# Patient Record
Sex: Male | Born: 1975 | Marital: Single | Smoking: Current every day smoker
Health system: Southern US, Community
[De-identification: ages and names within clinical notes are randomized; demographics above are authoritative.]

## PROBLEM LIST (undated history)

## (undated) DIAGNOSIS — F410 Panic disorder [episodic paroxysmal anxiety] without agoraphobia: Secondary | ICD-10-CM

## (undated) DIAGNOSIS — F419 Anxiety disorder, unspecified: Secondary | ICD-10-CM

## (undated) DIAGNOSIS — I10 Essential (primary) hypertension: Secondary | ICD-10-CM

## (undated) HISTORY — PX: SPHINCTEROTOMY: SHX5279

---

## 2005-02-02 ENCOUNTER — Ambulatory Visit: Payer: Self-pay | Admitting: Physical Medicine & Rehabilitation

## 2005-02-02 ENCOUNTER — Encounter
Admission: RE | Admit: 2005-02-02 | Discharge: 2005-05-03 | Payer: Self-pay | Admitting: Physical Medicine & Rehabilitation

## 2011-08-29 ENCOUNTER — Encounter (HOSPITAL_COMMUNITY): Payer: Self-pay

## 2011-08-29 ENCOUNTER — Emergency Department (HOSPITAL_COMMUNITY): Payer: Medicare Other

## 2011-08-29 ENCOUNTER — Emergency Department (HOSPITAL_COMMUNITY)
Admission: EM | Admit: 2011-08-29 | Discharge: 2011-08-29 | Disposition: A | Discharge status: Home / Self Care | Payer: Medicare Other | Attending: Emergency Medicine | Admitting: Emergency Medicine

## 2011-08-29 DIAGNOSIS — R112 Nausea with vomiting, unspecified: Secondary | ICD-10-CM | POA: Insufficient documentation

## 2011-08-29 DIAGNOSIS — K5900 Constipation, unspecified: Secondary | ICD-10-CM | POA: Insufficient documentation

## 2011-08-29 DIAGNOSIS — R109 Unspecified abdominal pain: Secondary | ICD-10-CM | POA: Insufficient documentation

## 2011-08-29 HISTORY — DX: Anxiety disorder, unspecified: F41.9

## 2011-08-29 HISTORY — DX: Essential (primary) hypertension: I10

## 2011-08-29 HISTORY — DX: Panic disorder (episodic paroxysmal anxiety): F41.0

## 2011-08-29 LAB — CBC WITH DIFFERENTIAL/PLATELET
Basophils Absolute: 0 10*3/uL (ref 0.0–0.1)
Eosinophils Relative: 1 % (ref 0–5)
Lymphocytes Relative: 22 % (ref 12–46)
Lymphs Abs: 3 10*3/uL (ref 0.7–4.0)
MCV: 89.7 fL (ref 78.0–100.0)
Neutro Abs: 9.5 10*3/uL — ABNORMAL HIGH (ref 1.7–7.7)
Neutrophils Relative %: 71 % (ref 43–77)
Platelets: 255 10*3/uL (ref 150–400)
RBC: 4.75 MIL/uL (ref 4.22–5.81)
RDW: 15.1 % (ref 11.5–15.5)
WBC: 13.3 10*3/uL — ABNORMAL HIGH (ref 4.0–10.5)

## 2011-08-29 LAB — URINALYSIS, ROUTINE W REFLEX MICROSCOPIC
Bilirubin Urine: NEGATIVE
Ketones, ur: NEGATIVE mg/dL
Leukocytes, UA: NEGATIVE
Nitrite: NEGATIVE
Specific Gravity, Urine: <1.005 — ABNORMAL LOW (ref 1.005–1.030)
Urobilinogen, UA: 0.2 mg/dL (ref 0.0–1.0)

## 2011-08-29 LAB — COMPREHENSIVE METABOLIC PANEL
ALT: 13 U/L (ref 0–53)
AST: 13 U/L (ref 0–37)
Alkaline Phosphatase: 82 U/L (ref 39–117)
CO2: 28 mEq/L (ref 19–32)
Calcium: 9.7 mg/dL (ref 8.4–10.5)
GFR calc non Af Amer: 67 mL/min — ABNORMAL LOW (ref >90)
Glucose, Bld: 99 mg/dL (ref 70–99)
Potassium: 3.9 mEq/L (ref 3.5–5.1)
Sodium: 130 mEq/L — ABNORMAL LOW (ref 135–145)

## 2011-08-29 MED ORDER — ONDANSETRON HCL 4 MG/2ML IJ SOLN: 4.0000 mg | INTRAMUSCULAR | Status: DC | PRN

## 2011-08-29 MED ORDER — SODIUM CHLORIDE 0.9 % IV BOLUS (SEPSIS)
500.0000 mL | Freq: Once | INTRAVENOUS | Status: AC
  Administered 2011-08-29: 19:00:00 via INTRAVENOUS

## 2011-08-29 MED ORDER — DICYCLOMINE HCL 20 MG PO TABS: 20.0000 mg | ORAL_TABLET | Freq: Four times a day (QID) | ORAL | Status: DC | PRN

## 2011-08-29 MED ORDER — SODIUM CHLORIDE 0.9 % IV SOLN
INTRAVENOUS | Status: DC
  Administered 2011-08-29 (×2): via INTRAVENOUS

## 2011-08-29 MED ORDER — FAMOTIDINE IN NACL 20-0.9 MG/50ML-% IV SOLN
20.0000 mg | Freq: Once | INTRAVENOUS | Status: AC
  Administered 2011-08-29: 20 mg via INTRAVENOUS
  Filled 2011-08-29: qty 50

## 2011-08-29 MED ORDER — DICYCLOMINE HCL 10 MG PO CAPS
20.0000 mg | ORAL_CAPSULE | Freq: Once | ORAL | Status: AC
  Administered 2011-08-29: 20 mg via ORAL
  Filled 2011-08-29: qty 1
  Filled 2011-08-29: qty 2

## 2011-08-29 MED ORDER — IOHEXOL 300 MG/ML  SOLN
100.0000 mL | Freq: Once | INTRAMUSCULAR | Status: AC | PRN
  Administered 2011-08-29: 100 mL via INTRAVENOUS

## 2011-08-29 NOTE — ED Notes (Signed)
Pt c/o weakness, abdominal cramps off and on x 1 month. Pt states that he had a sphincterotomy a month ago and his bowels have been better up until a week ago. Pt states that he has "hard stomach cramps". Last BM was Saturday. Pt states that he gets up in the morning and takes his Clonidine and lies back down and goes back to sleep. States that he forgets and thinks that he is taking too many per day. Pt alert and oriented x 3. Skin warm and dry. Color pink. Breath sounds clear and equal bilaterally. Abdomen soft and non tender.  Pt has positive bowel sounds in all quadrants.

## 2011-08-29 NOTE — ED Notes (Signed)
Pt sleeping with no complaints at this time. Family at bedside.

## 2011-08-29 NOTE — ED Notes (Signed)
Pt reports generalized abd pain off and on for the past week.  Describes as a "hard cramp."  Reports vomited x 1 Sunday.  Denies diarrhea.  LBM was Saturday.

## 2011-08-29 NOTE — ED Provider Notes (Signed)
History   Scribed for Antonio Anger, DO, the patient was seen in room APA18/APA18 . This chart was scribed by Antonio Fitzgerald.   CSN: [id number]  Arrival date & time 08/29/11  1545   First MD Initiated Contact with Patient 08/29/11 1635      Chief Complaint  Patient presents with  . Abdominal Pain     HPI Pt was seen at 1645. Antonio Fitzgerald is a 36 y.o. male who presents to the Emergency Department complaining of gradual onset and persistence of waxing and waning generalized abd "pain" for the past 1 month, worse over the past 2 weeks. Pt describes the pain as "cramping." Pt reports 1 episode of emesis 2 days ago. Has been associated with nausea and generalized weakness/fatigue. Pt states last BM was 4 days ago. Denies back pain, no diarrhea, no fevers, no rash, no CP/SOB, no testicular pain/swelling, no dysuria/hematuria.    Past Medical History  Diagnosis Date  . Hypertension   . Anxiety   . Panic attacks     Past Surgical History  Procedure Date  . Sphincterotomy     3-4 weeks ago    History  Substance Use Topics  . Smoking status: Current Everyday Smoker  . Smokeless tobacco: Not on file  . Alcohol Use: No      Review of Systems ROS: Statement: All systems negative except as marked or noted in the HPI; Constitutional: +generalized fatigue. Negative for fever and chills. ; ; Eyes: Negative for eye pain, redness and discharge. ; ; ENMT: Negative for ear pain, hoarseness, nasal congestion, sinus pressure and sore throat. ; ; Cardiovascular: Negative for chest pain, palpitations, diaphoresis, dyspnea and peripheral edema. ; ; Respiratory: Negative for cough, wheezing and stridor. ; ; Gastrointestinal: +N/V, abd pain. Negative for diarrhea, blood in stool, hematemesis, jaundice and rectal bleeding. . ; ; Genitourinary: Negative for dysuria, flank pain and hematuria. ; ; Musculoskeletal: Negative for back pain and neck pain. Negative for swelling and trauma.; ; Skin:  Negative for pruritus, rash, abrasions, blisters, bruising and skin lesion.; ; Neuro: Negative for headache, lightheadedness and neck stiffness. Negative for weakness, altered level of consciousness , altered mental status, extremity weakness, paresthesias, involuntary movement, seizure and syncope.       Allergies  Review of patient's allergies indicates no known allergies.  Home Medications   Current Outpatient Rx  Name Route Sig Dispense Refill  . CLONAZEPAM 1 MG PO TABS Oral Take 1 mg by mouth 4 (four) times daily.    Marland Kitchen CLONIDINE HCL 0.2 MG PO TABS Oral Take 0.2 mg by mouth 2 (two) times daily.    Marland Kitchen ZOLPIDEM TARTRATE 10 MG PO TABS Oral Take 10 mg by mouth at bedtime as needed. For sleep      BP 119/90  Pulse 66  Temp 98.4 F (36.9 C) (Oral)  Resp 18  Ht 5\' 9"  (1.753 m)  Wt 245 lb (111.131 kg)  BMI 36.18 kg/m2  SpO2 96%  Physical Exam 1650: Physical examination:  Nursing notes reviewed; Vital signs and O2 SAT reviewed;  Constitutional: Well developed, Well nourished, Well hydrated, In no acute distress; Head:  Normocephalic, atraumatic; Eyes: EOMI, PERRL, No scleral icterus; ENMT: Mouth and pharynx normal, Mucous membranes moist; Neck: Supple, Full range of motion, No lymphadenopathy; Cardiovascular: Regular rate and rhythm, No murmur, rub, or gallop; Respiratory: Breath sounds clear & equal bilaterally, No rales, rhonchi, wheezes.  Speaking full sentences with ease, Normal respiratory effort/excursion; Chest: Nontender, Movement normal; Abdomen:  Soft, +mild diffuse tenderness to palp. No rebound or guarding. Nondistended, Normal bowel sounds;; Extremities: Pulses normal, No tenderness, No edema, No calf edema or asymmetry.; Neuro: AA&Ox3, Major CN grossly intact.  Speech clear. No gross focal motor or sensory deficits in extremities.; Skin: Color normal, Warm, Dry.; Psych:  Affect flat, poor eye contact.    ED Course  Procedures   MDM  MDM Reviewed: nursing note and  vitals Interpretation: labs and CT scan   Results for orders placed during the hospital encounter of 08/29/11  CBC WITH DIFFERENTIAL      Component Value Range   WBC 13.3 (*) 4.0 - 10.5 K/uL   RBC 4.75  4.22 - 5.81 MIL/uL   Hemoglobin 15.6  13.0 - 17.0 g/dL   HCT 16.1  09.6 - 04.5 %   MCV 89.7  78.0 - 100.0 fL   MCH 32.8  26.0 - 34.0 pg   MCHC 36.6 (*) 30.0 - 36.0 g/dL   RDW 40.9  81.1 - 91.4 %   Platelets 255  150 - 400 K/uL   Neutrophils Relative 71  43 - 77 %   Neutro Abs 9.5 (*) 1.7 - 7.7 K/uL   Lymphocytes Relative 22  12 - 46 %   Lymphs Abs 3.0  0.7 - 4.0 K/uL   Monocytes Relative 6  3 - 12 %   Monocytes Absolute 0.8  0.1 - 1.0 K/uL   Eosinophils Relative 1  0 - 5 %   Eosinophils Absolute 0.1  0.0 - 0.7 K/uL   Basophils Relative 0  0 - 1 %   Basophils Absolute 0.0  0.0 - 0.1 K/uL  COMPREHENSIVE METABOLIC PANEL      Component Value Range   Sodium 130 (*) 135 - 145 mEq/L   Potassium 3.9  3.5 - 5.1 mEq/L   Chloride 93 (*) 96 - 112 mEq/L   CO2 28  19 - 32 mEq/L   Glucose, Bld 99  70 - 99 mg/dL   BUN 7  6 - 23 mg/dL   Creatinine, Ser 7.82  0.50 - 1.35 mg/dL   Calcium 9.7  8.4 - 95.6 mg/dL   Total Protein 7.7  6.0 - 8.3 g/dL   Albumin 4.0  3.5 - 5.2 g/dL   AST 13  0 - 37 U/L   ALT 13  0 - 53 U/L   Alkaline Phosphatase 82  39 - 117 U/L   Total Bilirubin 0.4  0.3 - 1.2 mg/dL   GFR calc non Af Amer 67 (*) >90 mL/min   GFR calc Af Amer 77 (*) >90 mL/min  LIPASE, BLOOD      Component Value Range   Lipase 11  11 - 59 U/L  URINALYSIS, ROUTINE W REFLEX MICROSCOPIC      Component Value Range   Color, Urine YELLOW  YELLOW   APPearance CLEAR  CLEAR   Specific Gravity, Urine <1.005 (*) 1.005 - 1.030   pH 6.0  5.0 - 8.0   Glucose, UA NEGATIVE  NEGATIVE mg/dL   Hgb urine dipstick NEGATIVE  NEGATIVE   Bilirubin Urine NEGATIVE  NEGATIVE   Ketones, ur NEGATIVE  NEGATIVE mg/dL   Protein, ur NEGATIVE  NEGATIVE mg/dL   Urobilinogen, UA 0.2  0.0 - 1.0 mg/dL   Nitrite NEGATIVE   NEGATIVE   Leukocytes, UA NEGATIVE  NEGATIVE   Ct Abdomen Pelvis W Contrast 08/29/2011  *RADIOLOGY REPORT*  Clinical Data: Overall with cramping and pain.  History of sphincterotomy 1  month ago.  No bowel movement for 3 days.  CT ABDOMEN AND PELVIS WITH CONTRAST  Technique:  Multidetector CT imaging of the abdomen and pelvis was performed following the standard protocol during bolus administration of intravenous contrast.  Contrast: OMNIPAQUE IOHEXOL 300 MG/ML  SOLN  Comparison: None.  Findings: There is some atelectasis in the lung bases.  No pleural or pericardial effusion.  A tiny hypoattenuating lesion in the right hepatic lobe on image 15 cannot be characterized but likely represents a cyst.  A second similar lesion is seen in the right hepatic lobe on image 23.  The liver is otherwise unremarkable.  The gallbladder, biliary tree, adrenal glands, spleen, pancreas and left kidney appear normal.  A 0.6 cm hypoattenuating lesion in the right kidney cannot be definitively characterize but likely represents a cyst.  The stomach, small bowel and appendix appear normal.  The sigmoid colon is decompressed but there is a fairly prominent volume of gas and stool throughout the remainder of the colon, particularly the ascending and transverse colon.  There is no lymphadenopathy or fluid.  No focal bony abnormality.  IMPRESSION:  1.  No acute finding. 2.  Large volume gas and stool in the colon, particularly the ascending and transverse.  Original Report Authenticated By: Bernadene Bell. D'ALESSIO, M.D.     1945:  Pt feels improved after meds and wants to go home now.  IVF given, VSS.  Dx testing d/w pt and family.  Questions answered.  Verb understanding, agreeable to d/c home with outpt f/u.        I personally performed the services described in this documentation, which was scribed in my presence. The recorded information has been reviewed and considered. Kilah Drahos Allison Quarry,  DO 08/30/11 1850

## 2011-08-31 LAB — URINE CULTURE: Culture: NO GROWTH

## 2015-05-17 ENCOUNTER — Emergency Department (HOSPITAL_COMMUNITY)
Admission: EM | Admit: 2015-05-17 | Discharge: 2015-05-17 | Discharge status: Against Medical Advice | Payer: Medicare HMO | Attending: Dermatology | Admitting: Dermatology

## 2015-05-17 ENCOUNTER — Encounter (HOSPITAL_COMMUNITY): Payer: Self-pay | Admitting: Emergency Medicine

## 2015-05-17 DIAGNOSIS — Z5321 Procedure and treatment not carried out due to patient leaving prior to being seen by health care provider: Secondary | ICD-10-CM | POA: Insufficient documentation

## 2015-05-17 DIAGNOSIS — F17200 Nicotine dependence, unspecified, uncomplicated: Secondary | ICD-10-CM | POA: Diagnosis not present

## 2015-05-17 DIAGNOSIS — R51 Headache: Secondary | ICD-10-CM | POA: Insufficient documentation

## 2015-05-17 DIAGNOSIS — I10 Essential (primary) hypertension: Secondary | ICD-10-CM | POA: Diagnosis not present
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
## 2015-05-17 NOTE — ED Notes (Signed)
Called patient to place in room. Patient states he does not want to be seen. States his family member is here and getting ready to be discharged and he wants to go home.

## 2015-06-06 ENCOUNTER — Encounter (HOSPITAL_COMMUNITY): Payer: Self-pay

## 2015-06-06 ENCOUNTER — Emergency Department (HOSPITAL_COMMUNITY)
Admission: EM | Admit: 2015-06-06 | Discharge: 2015-06-06 | Disposition: A | Discharge status: Home / Self Care | Payer: Medicare HMO | Attending: Emergency Medicine | Admitting: Emergency Medicine

## 2015-06-06 DIAGNOSIS — F13239 Sedative, hypnotic or anxiolytic dependence with withdrawal, unspecified: Secondary | ICD-10-CM | POA: Diagnosis present

## 2015-06-06 DIAGNOSIS — S80812A Abrasion, left lower leg, initial encounter: Secondary | ICD-10-CM | POA: Diagnosis not present

## 2015-06-06 DIAGNOSIS — W1839XA Other fall on same level, initial encounter: Secondary | ICD-10-CM | POA: Insufficient documentation

## 2015-06-06 DIAGNOSIS — S40812A Abrasion of left upper arm, initial encounter: Secondary | ICD-10-CM | POA: Diagnosis not present

## 2015-06-06 DIAGNOSIS — F17200 Nicotine dependence, unspecified, uncomplicated: Secondary | ICD-10-CM | POA: Diagnosis not present

## 2015-06-06 DIAGNOSIS — Z79899 Other long term (current) drug therapy: Secondary | ICD-10-CM | POA: Insufficient documentation

## 2015-06-06 DIAGNOSIS — F329 Major depressive disorder, single episode, unspecified: Secondary | ICD-10-CM | POA: Insufficient documentation

## 2015-06-06 DIAGNOSIS — Y9389 Activity, other specified: Secondary | ICD-10-CM | POA: Insufficient documentation

## 2015-06-06 DIAGNOSIS — F1110 Opioid abuse, uncomplicated: Secondary | ICD-10-CM | POA: Diagnosis not present

## 2015-06-06 DIAGNOSIS — I10 Essential (primary) hypertension: Secondary | ICD-10-CM | POA: Insufficient documentation

## 2015-06-06 DIAGNOSIS — S80811A Abrasion, right lower leg, initial encounter: Secondary | ICD-10-CM | POA: Insufficient documentation

## 2015-06-06 DIAGNOSIS — F410 Panic disorder [episodic paroxysmal anxiety] without agoraphobia: Secondary | ICD-10-CM | POA: Diagnosis not present

## 2015-06-06 DIAGNOSIS — Y9289 Other specified places as the place of occurrence of the external cause: Secondary | ICD-10-CM | POA: Diagnosis not present

## 2015-06-06 DIAGNOSIS — S40811A Abrasion of right upper arm, initial encounter: Secondary | ICD-10-CM | POA: Diagnosis not present

## 2015-06-06 DIAGNOSIS — F13930 Sedative, hypnotic or anxiolytic use, unspecified with withdrawal, uncomplicated: Secondary | ICD-10-CM

## 2015-06-06 DIAGNOSIS — F419 Anxiety disorder, unspecified: Secondary | ICD-10-CM

## 2015-06-06 DIAGNOSIS — R000 Tachycardia, unspecified: Secondary | ICD-10-CM | POA: Insufficient documentation

## 2015-06-06 DIAGNOSIS — F13230 Sedative, hypnotic or anxiolytic dependence with withdrawal, uncomplicated: Secondary | ICD-10-CM | POA: Insufficient documentation

## 2015-06-06 DIAGNOSIS — Y998 Other external cause status: Secondary | ICD-10-CM | POA: Insufficient documentation

## 2015-06-06 LAB — COMPREHENSIVE METABOLIC PANEL
ALBUMIN: 3.9 g/dL (ref 3.5–5.0)
ALT: 17 U/L (ref 17–63)
ANION GAP: 7 (ref 5–15)
AST: 28 U/L (ref 15–41)
Alkaline Phosphatase: 70 U/L (ref 38–126)
BILIRUBIN TOTAL: 0.9 mg/dL (ref 0.3–1.2)
CHLORIDE: 101 mmol/L (ref 101–111)
CO2: 28 mmol/L (ref 22–32)
Calcium: 9.6 mg/dL (ref 8.9–10.3)
Creatinine, Ser: 1.08 mg/dL (ref 0.61–1.24)
GFR calc Af Amer: >60 mL/min (ref >60)
GFR calc non Af Amer: >60 mL/min (ref >60)
GLUCOSE: 117 mg/dL — AB (ref 65–99)
POTASSIUM: 3.7 mmol/L (ref 3.5–5.1)
SODIUM: 136 mmol/L (ref 135–145)
Total Protein: 7.2 g/dL (ref 6.5–8.1)

## 2015-06-06 LAB — CBC WITH DIFFERENTIAL/PLATELET
BASOS PCT: 0 %
Basophils Absolute: 0 10*3/uL (ref 0.0–0.1)
Eosinophils Absolute: 0 10*3/uL (ref 0.0–0.7)
Eosinophils Relative: 0 %
HEMATOCRIT: 43.2 % (ref 39.0–52.0)
HEMOGLOBIN: 15.1 g/dL (ref 13.0–17.0)
LYMPHS PCT: 23 %
Lymphs Abs: 2.6 10*3/uL (ref 0.7–4.0)
MCH: 30.6 pg (ref 26.0–34.0)
MCHC: 35 g/dL (ref 30.0–36.0)
MCV: 87.4 fL (ref 78.0–100.0)
MONO ABS: 1 10*3/uL (ref 0.1–1.0)
MONOS PCT: 9 %
NEUTROS ABS: 7.8 10*3/uL — AB (ref 1.7–7.7)
NEUTROS PCT: 68 %
Platelets: 211 10*3/uL (ref 150–400)
RBC: 4.94 MIL/uL (ref 4.22–5.81)
RDW: 13.5 % (ref 11.5–15.5)
WBC: 11.5 10*3/uL — ABNORMAL HIGH (ref 4.0–10.5)

## 2015-06-06 LAB — RAPID URINE DRUG SCREEN, HOSP PERFORMED
AMPHETAMINES: NOT DETECTED
Barbiturates: NOT DETECTED
Benzodiazepines: NOT DETECTED
COCAINE: NOT DETECTED
Opiates: POSITIVE — AB
Tetrahydrocannabinol: NOT DETECTED

## 2015-06-06 LAB — ETHANOL: Alcohol, Ethyl (B): <5 mg/dL (ref <5)

## 2015-06-06 MED ORDER — LORAZEPAM 2 MG/ML IJ SOLN
2.0000 mg | Freq: Once | INTRAMUSCULAR | Status: AC
Start: 2015-06-06 — End: 2015-06-06
  Administered 2015-06-06: 2 mg via INTRAVENOUS
  Filled 2015-06-06: qty 1

## 2015-06-06 MED ORDER — ALPRAZOLAM 1 MG PO TABS: 1.0000 mg | ORAL_TABLET | Freq: Four times a day (QID) | ORAL | Status: AC

## 2015-06-06 NOTE — Discharge Instructions (Signed)
Benzodiazepine Withdrawal  °Benzodiazepines are a group of drugs that are prescribed for both short-term and long-term treatment of a variety of medical conditions. For some of these conditions, such as seizures and sudden and severe muscle spasms, they are used only for a few hours or a few days. For other conditions, such as anxiety, sleep problems, or frequent muscle spasms or to help prevent seizures, they are used for an extended period, usually weeks or months. °Benzodiazepines work by changing the way your brain functions. Normally, chemicals in your brain called neurotransmitters send messages between your brain cells. The neurotransmitter that benzodiazepines affect is called gamma-aminobutyric acid (GABA). GABA sends out messages that have a calming effect on many of the functions of your brain. Benzodiazepines make these messages stronger and increase this calming effect. °Short-term use of benzodiazepines usually does not cause problems when you stop taking the drugs. However, if you take benzodiazepines for a long time, your body can adjust to the drug and require more of it to produce the same effect (drug tolerance). Eventually, you can develop physical dependence on benzodiazepines, which is when you experience negative effects if your dosage of benzodiazepines is reduced or stopped too quickly. These negative effects are called symptoms of withdrawal. °SYMPTOMS °Symptoms of withdrawal may begin anytime within the first 10 days after you stop taking the benzodiazepine. They can last from several weeks up to a few months but usually are the worst between the first 10 to 14 days.  °The actual symptoms also vary, depending on the type of benzodiazepine you take. Possible symptoms include: °· Anxiety. °· Excitability. °· Irritability. °· Depression. °· Mood swings. °· Trouble sleeping. °· Confusion. °· Uncontrollable shaking (tremors). °· Muscle weakness. °· Seizures. °DIAGNOSIS °To diagnose  benzodiazepine withdrawal, your caregiver will examine you for certain signs, such as: °· Rapid heartbeat. °· Rapid breathing. °· Tremors. °· High blood pressure. °· Fever. °· Mood changes. °Your caregiver also may ask the following questions about your use of benzodiazepines: °· What type of benzodiazepine did you take? °· How much did you take each day? °· How long did you take the drug? °· When was the last time you took the drug? °· Do you take any other drugs? °· Have you had alcohol recently? °· Have you had a seizure recently? °· Have you lost consciousness recently? °· Have you had trouble remembering recent events? °· Have you had a recent increase in anxiety, irritability, or trouble sleeping? °A drug test also may be administered. °TREATMENT °The treatment for benzodiazepine withdrawal can vary, depending on the type and severity of your symptoms, what type of benzodiazepine you have been taking, and how long you have been taking the benzodiazepine. Sometimes it is necessary for you to be treated in a hospital, especially if you are at risk of seizures.  °Often, treatment includes a prescription for a long-acting benzodiazepine, the dosage of which is reduced slowly over a long period. This period could be several weeks or months. Eventually, your dosage will be reduced to a point that you can stop taking the drug, without experiencing withdrawal symptoms. This is called tapered withdrawal. Occasionally, minor symptoms of withdrawal continue for a few days or weeks after you have completed a tapered withdrawal. °SEEK IMMEDIATE MEDICAL CARE IF: °· You have a seizure. °· You develop a craving for drugs or alcohol. °· You begin to experience symptoms of withdrawal during your tapered withdrawal. °· You become very confused. °· You lose consciousness. °· You   have trouble breathing. °· You think about hurting yourself or someone else. °  °This information is not intended to replace advice given to you by your  health care provider. Make sure you discuss any questions you have with your health care provider. °  °Document Released: 12/22/2010 Document Revised: 01/23/2014 Document Reviewed: 06/24/2014 °Elsevier Interactive Patient Education ©2016 [employer] ° °

## 2015-06-06 NOTE — ED Notes (Signed)
Pt placed on monitor and on to monitor upon arrival to room.Pt monitored by blood pressure, pulse ox, and 12 lead. Pt is noted to have mother at bedside. PA at bedside.

## 2015-06-06 NOTE — ED Notes (Addendum)
Pt given urinal so he can give us a sample.
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
## 2015-06-06 NOTE — ED Notes (Addendum)
Patient has not had his xanax in 1 week, was taking 4mg  daily. Reports seizure last night due to same. Anxious and tremor noted on arrival. Alert and oriented

## 2016-08-11 ENCOUNTER — Emergency Department (HOSPITAL_COMMUNITY)
Admission: EM | Admit: 2016-08-11 | Discharge: 2016-08-11 | Discharge status: Against Medical Advice | Payer: Medicare HMO | Attending: Emergency Medicine | Admitting: Emergency Medicine

## 2016-08-11 ENCOUNTER — Encounter (HOSPITAL_COMMUNITY): Payer: Self-pay | Admitting: Emergency Medicine

## 2016-08-11 ENCOUNTER — Emergency Department (HOSPITAL_COMMUNITY): Payer: Medicare HMO

## 2016-08-11 DIAGNOSIS — F17200 Nicotine dependence, unspecified, uncomplicated: Secondary | ICD-10-CM | POA: Insufficient documentation

## 2016-08-11 DIAGNOSIS — F13230 Sedative, hypnotic or anxiolytic dependence with withdrawal, uncomplicated: Secondary | ICD-10-CM | POA: Insufficient documentation

## 2016-08-11 DIAGNOSIS — R531 Weakness: Secondary | ICD-10-CM | POA: Diagnosis present

## 2016-08-11 DIAGNOSIS — Z79899 Other long term (current) drug therapy: Secondary | ICD-10-CM | POA: Insufficient documentation

## 2016-08-11 DIAGNOSIS — F13930 Sedative, hypnotic or anxiolytic use, unspecified with withdrawal, uncomplicated: Secondary | ICD-10-CM

## 2016-08-11 DIAGNOSIS — I10 Essential (primary) hypertension: Secondary | ICD-10-CM | POA: Insufficient documentation

## 2016-08-11 LAB — CBC WITH DIFFERENTIAL/PLATELET
Basophils Absolute: 0 10*3/uL (ref 0.0–0.1)
Basophils Relative: 0 %
Eosinophils Absolute: 0 10*3/uL (ref 0.0–0.7)
Eosinophils Relative: 0 %
HCT: 46.5 % (ref 39.0–52.0)
Hemoglobin: 17.9 g/dL — ABNORMAL HIGH (ref 13.0–17.0)
Lymphocytes Relative: 20 %
Lymphs Abs: 3.3 10*3/uL (ref 0.7–4.0)
MCH: 32.2 pg (ref 26.0–34.0)
MCHC: 37.8 g/dL — ABNORMAL HIGH (ref 30.0–36.0)
MCV: 85 fL (ref 78.0–100.0)
Monocytes Absolute: 1.2 10*3/uL — ABNORMAL HIGH (ref 0.1–1.0)
Monocytes Relative: 7 %
Neutro Abs: 12.3 10*3/uL — ABNORMAL HIGH (ref 1.7–7.7)
Neutrophils Relative %: 73 %
Platelets: 203 10*3/uL (ref 150–400)
RBC: 5.47 MIL/uL (ref 4.22–5.81)
RDW: 12.8 % (ref 11.5–15.5)
WBC: 16.8 10*3/uL — ABNORMAL HIGH (ref 4.0–10.5)

## 2016-08-11 LAB — BASIC METABOLIC PANEL
Anion gap: 13 (ref 5–15)
BUN: 6 mg/dL (ref 6–20)
CO2: 24 mmol/L (ref 22–32)
Calcium: 9.6 mg/dL (ref 8.9–10.3)
Chloride: 97 mmol/L — ABNORMAL LOW (ref 101–111)
Creatinine, Ser: 0.96 mg/dL (ref 0.61–1.24)
GFR calc Af Amer: >60 mL/min (ref >60)
GFR calc non Af Amer: >60 mL/min (ref >60)
Glucose, Bld: 120 mg/dL — ABNORMAL HIGH (ref 65–99)
Potassium: 3.5 mmol/L (ref 3.5–5.1)
Sodium: 134 mmol/L — ABNORMAL LOW (ref 135–145)

## 2016-08-11 MED ORDER — SODIUM CHLORIDE 0.9 % IV BOLUS (SEPSIS)
1000.0000 mL | Freq: Once | INTRAVENOUS | Status: AC
  Administered 2016-08-11: 1000 mL via INTRAVENOUS

## 2016-08-11 MED ORDER — ONDANSETRON HCL 4 MG/2ML IJ SOLN
4.0000 mg | Freq: Once | INTRAMUSCULAR | Status: AC
  Administered 2016-08-11: 4 mg via INTRAVENOUS
  Filled 2016-08-11: qty 2

## 2016-08-11 MED ORDER — LORAZEPAM 2 MG/ML IJ SOLN
1.0000 mg | Freq: Once | INTRAMUSCULAR | Status: AC
  Administered 2016-08-11: 1 mg via INTRAVENOUS
  Filled 2016-08-11: qty 1

## 2016-08-11 NOTE — ED Notes (Signed)
[facility]

## 2016-08-11 NOTE — ED Triage Notes (Addendum)
Pt reports he is withdrawing from xanax. Last use a week ago and symptoms started afterwards. Pt reports he has weakness and shakiness.  Denies ETOH use at home. Hypertensive in triage. Pt on clonidine for BP, last use this am.

## 2016-08-11 NOTE — ED Notes (Signed)
Walked into room to get urine specimen. Pt stated that he wanted to leave and that a BP of 180/120 is normal for him.  I told pt that the urine was the last thing we needed.  Pt demanded to leave, started to walk down hall followed by his mother.  I asked that pt stop so I could remove the IV and have him sign out AMA.  IV removed nad bleeding controlled.  Pt refused to sign anything and walked out with mother.  [facility] aware.  VS stable.  A&Ox4.  Ambulatory w/steady gait.

## 2016-08-11 NOTE — ED Provider Notes (Signed)
[facility]-EMERGENCY DEPT Provider Note   CSN: [id number] Arrival date & time: 08/11/16  1633     History   Chief Complaint Chief Complaint  Patient presents with  . Hypertension  . Weakness    HPI Antonio Fitzgerald is a 41 y.o. male with history of anxiety, HTN, and panic attacks who presents today with complaint of acute onset, gradually worsening generalized body pain for 6 days. He states he takes 1mg  Xanax 4 times daily for anxiety and 7 days ago he awoke in the middle of the night having a panic attack. He states he reached for the bottle of Xanax which tipped over and the tablets fell down an air vent. States he has not had Xanax in 7 days. Since then he has been experiencing generalized all over body pain which she describes as a tightness. Denies shortness of breath, seizure-like activity, syncope, diarrhea, melena, hematochezia. He does endorse nausea and vomiting. He lives with his mother and daughter and does not think he has had any seizures or syncopal episodes to his knowledge. No aggravating or alleviating factors noted. He endorses headaches, back pain, neck pain, abdominal pain, chest pain, and generalized arthralgias and myalgias. He does endorse weakness but denies numbness and tingling. He has had decreased appetite and decreased fluid intake. He does also take Suboxone and Ambien. Prescribing physician is Dr. Danley Danker in [place], [place].   The history is provided by the patient.    Past Medical History:  Diagnosis Date  . Anxiety   . Hypertension   . Panic attacks     There are no active problems to display for this patient.   Past Surgical History:  Procedure Laterality Date  . SPHINCTEROTOMY     3-4 weeks ago       Home Medications    Prior to Admission medications   Medication Sig Start Date End Date Taking? Authorizing Provider  citalopram (CELEXA) 20 MG tablet Take 20 mg by mouth daily.  08/01/16  Yes [provider]  cloNIDine (CATAPRES) 0.3 MG  tablet Take 0.3 mg by mouth 2 (two) times daily.  08/01/16  Yes [provider]  escitalopram (LEXAPRO) 10 MG tablet Take 20 mg by mouth daily.   Yes [provider]  lisinopril (PRINIVIL,ZESTRIL) 10 MG tablet Take 10 mg by mouth daily.  08/01/16  Yes [provider]  SUBOXONE 8-2 MG FILM Place 0.5 strips under the tongue 3 (three) times daily. 04/29/15  Yes [provider]  zolpidem (AMBIEN) 10 MG tablet Take 10 mg by mouth at bedtime. For sleep    Yes [provider]  ALPRAZolam (XANAX) 1 MG tablet Take 1 tablet (1 mg total) by mouth 4 (four) times daily. Patient not taking: Reported on 08/11/2016 06/06/15   Cheri Fowler, PA-C    Family History History reviewed. No pertinent family history.  Social History Social History  Substance Use Topics  . Smoking status: Current Every Day Smoker  . Smokeless tobacco: Not on file  . Alcohol use No     Allergies   Patient has no known allergies.   Review of Systems Review of Systems  Constitutional: Positive for appetite change, chills and fatigue. Negative for fever.  Eyes: Positive for visual disturbance (blurred vision).  Respiratory: Positive for chest tightness. Negative for shortness of breath.   Cardiovascular: Positive for chest pain.  Gastrointestinal: Positive for abdominal pain, nausea and vomiting. Negative for blood in stool, constipation and diarrhea.  Genitourinary: Negative for dysuria and hematuria.  Musculoskeletal: Positive for arthralgias, back pain, myalgias and neck pain.  Neurological: Positive for weakness and headaches. Negative for tremors, seizures and numbness.  Psychiatric/Behavioral: The patient is nervous/anxious.      Physical Exam Updated Vital Signs BP (!) 185/122   Pulse (!) 108   Temp 99.8 F (37.7 C) (Oral)   Resp 18   SpO2 99%   Physical Exam  Constitutional: He is oriented to person, place, and time. He appears well-developed and well-nourished. No  distress.  Appears anxious, moves around frequently  HENT:  Head: Normocephalic and atraumatic.  Right Ear: External ear normal.  Left Ear: External ear normal.  Mouth/Throat: Oropharynx is clear and moist.  No Battle's signs, no raccoon's eyes, no rhinorrhea. No hemotympanum. No tenderness to palpation of the face or skull. No deformity, crepitus, or swelling noted.   Eyes: Pupils are equal, round, and reactive to light. Conjunctivae and EOM are normal. Right eye exhibits no discharge. Left eye exhibits no discharge.  Pupils dilated 5 mm bilaterally  Neck: No JVD present. No tracheal deviation present.  Cardiovascular: Normal rate and intact distal pulses.   Tachycardic, 2+ DP/PT pulses and radial pulses bilaterally  Pulmonary/Chest: Effort normal and breath sounds normal. He exhibits tenderness.  Equal rise and fall of chest, no increased work of breathing, generalized tenderness to palpation of the chest wall. No paradoxical wall motion  Abdominal: Soft. Bowel sounds are normal. He exhibits no distension. There is tenderness.  Diffuse generalized tenderness to palpation, Murphy's absent  Musculoskeletal: Normal range of motion. He exhibits tenderness. He exhibits no edema or deformity.  Diffuse generalized tenderness to palpation of the back, extremities, anterior chest. No deformity, crepitus, or step-off noted. 5/5 strength of BLE and BLE major muscle groups.  Neurological: He is alert and oriented to person, place, and time. No cranial nerve deficit or sensory deficit.  Fluent speech, no facial droop, sensation intact to soft touch of extremities, normal gait, and patient able to heel walk and toe walk without difficulty. Cranial nerves III through XII tested and intact.   Skin: Skin is warm. He is diaphoretic. No erythema.  Psychiatric: His speech is normal and behavior is normal. His mood appears anxious.  Nursing note and vitals reviewed.    ED Treatments / Results  Labs (all  labs ordered are listed, but only abnormal results are displayed) Labs Reviewed  BASIC METABOLIC PANEL - Abnormal; Notable for the following:       Result Value   Sodium 134 (*)    Chloride 97 (*)    Glucose, Bld 120 (*)    All other components within normal limits  CBC WITH DIFFERENTIAL/PLATELET - Abnormal; Notable for the following:    WBC 16.8 (*)    Hemoglobin 17.9 (*)    MCHC 37.8 (*)    Neutro Abs 12.3 (*)    Monocytes Absolute 1.2 (*)    All other components within normal limits  RAPID URINE DRUG SCREEN, HOSP PERFORMED  URINALYSIS, ROUTINE W REFLEX MICROSCOPIC    EKG  EKG Interpretation None       Radiology Dg Chest 2 View  Result Date: 08/11/2016 CLINICAL DATA:  Chest pain and weakness EXAM: CHEST  2 VIEW COMPARISON:  None. FINDINGS: The heart size and mediastinal contours are within normal limits. Both lungs are clear. The visualized skeletal structures are unremarkable. IMPRESSION: No active cardiopulmonary disease. Electronically Signed   By: Alcide CleverMark  Lukens M.D.   On: 08/11/2016 19:39    Procedures Procedures (  including critical care time)  Medications Ordered in ED Medications  LORazepam (ATIVAN) injection 1 mg (1 mg Intravenous Given 08/11/16 1757)  sodium chloride 0.9 % bolus 1,000 mL (0 mLs Intravenous Stopped 08/11/16 1939)  ondansetron (ZOFRAN) injection 4 mg (4 mg Intravenous Given 08/11/16 1757)     Initial Impression / Assessment and Plan / ED Course  I have reviewed the triage vital signs and the nursing notes.  Pertinent labs & imaging results that were available during my care of the patient were reviewed by me and considered in my medical decision making (see chart for details).     Patient presented with evidence of benzodiazepine withdrawal. Has not had his Xanax and 6 days. Afebrile, hypertensive and tachycardic with some improvement while in the ED. Ativan, fluids, and Zofran given with some improvement. BMP shows no significant electrolyte  abnormality, CBC does show leukocytosis of 16.8. Will obtain chest x-ray and urine sample for further workup to attempt to ascertain possible source of infection. Chest x-ray reviewed by me shows no active card pulmonary disease. Unable to obtain urine sample or complete workup as patient absconded from the ED without my knowledge   Final Clinical Impressions(s) / ED Diagnoses   Final diagnoses:  Benzodiazepine withdrawal without complication [facility](HCC)    New Prescriptions Discharge Medication List as of 08/11/2016  9:04 PM       Jeanie SewerFawze, Romonia Yanik A, PA-C 08/11/16 2304    Tegeler, Canary Brimhristopher J, MD 08/12/16 [phone]

## 2018-08-18 IMAGING — CR DG CHEST 2V
2 series · 2 of 2 positions shown · non-contrast
Comparison: None.

CLINICAL DATA: Chest pain and weakness

EXAM:
CHEST  2 VIEW

[w chest pa]
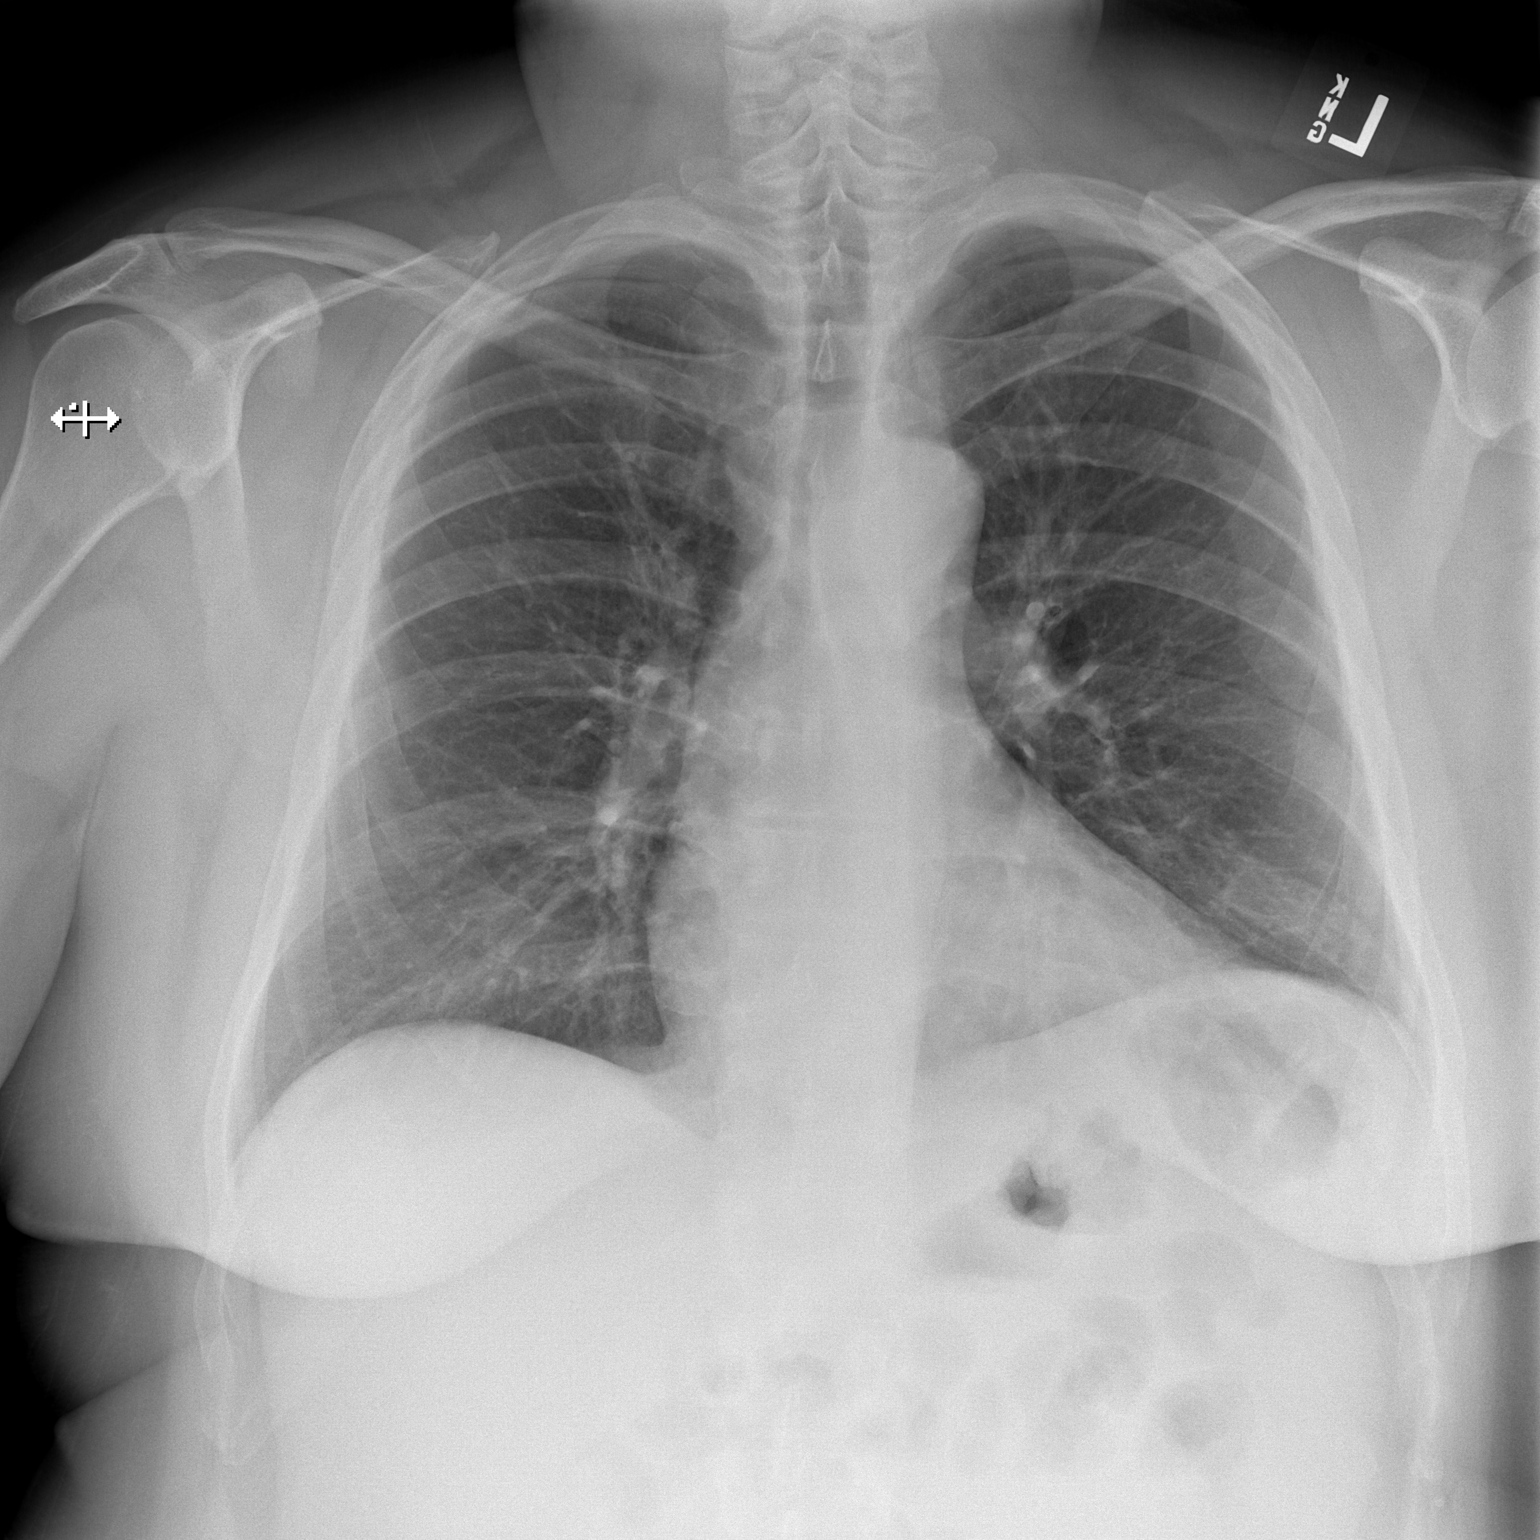

[w chest lat]
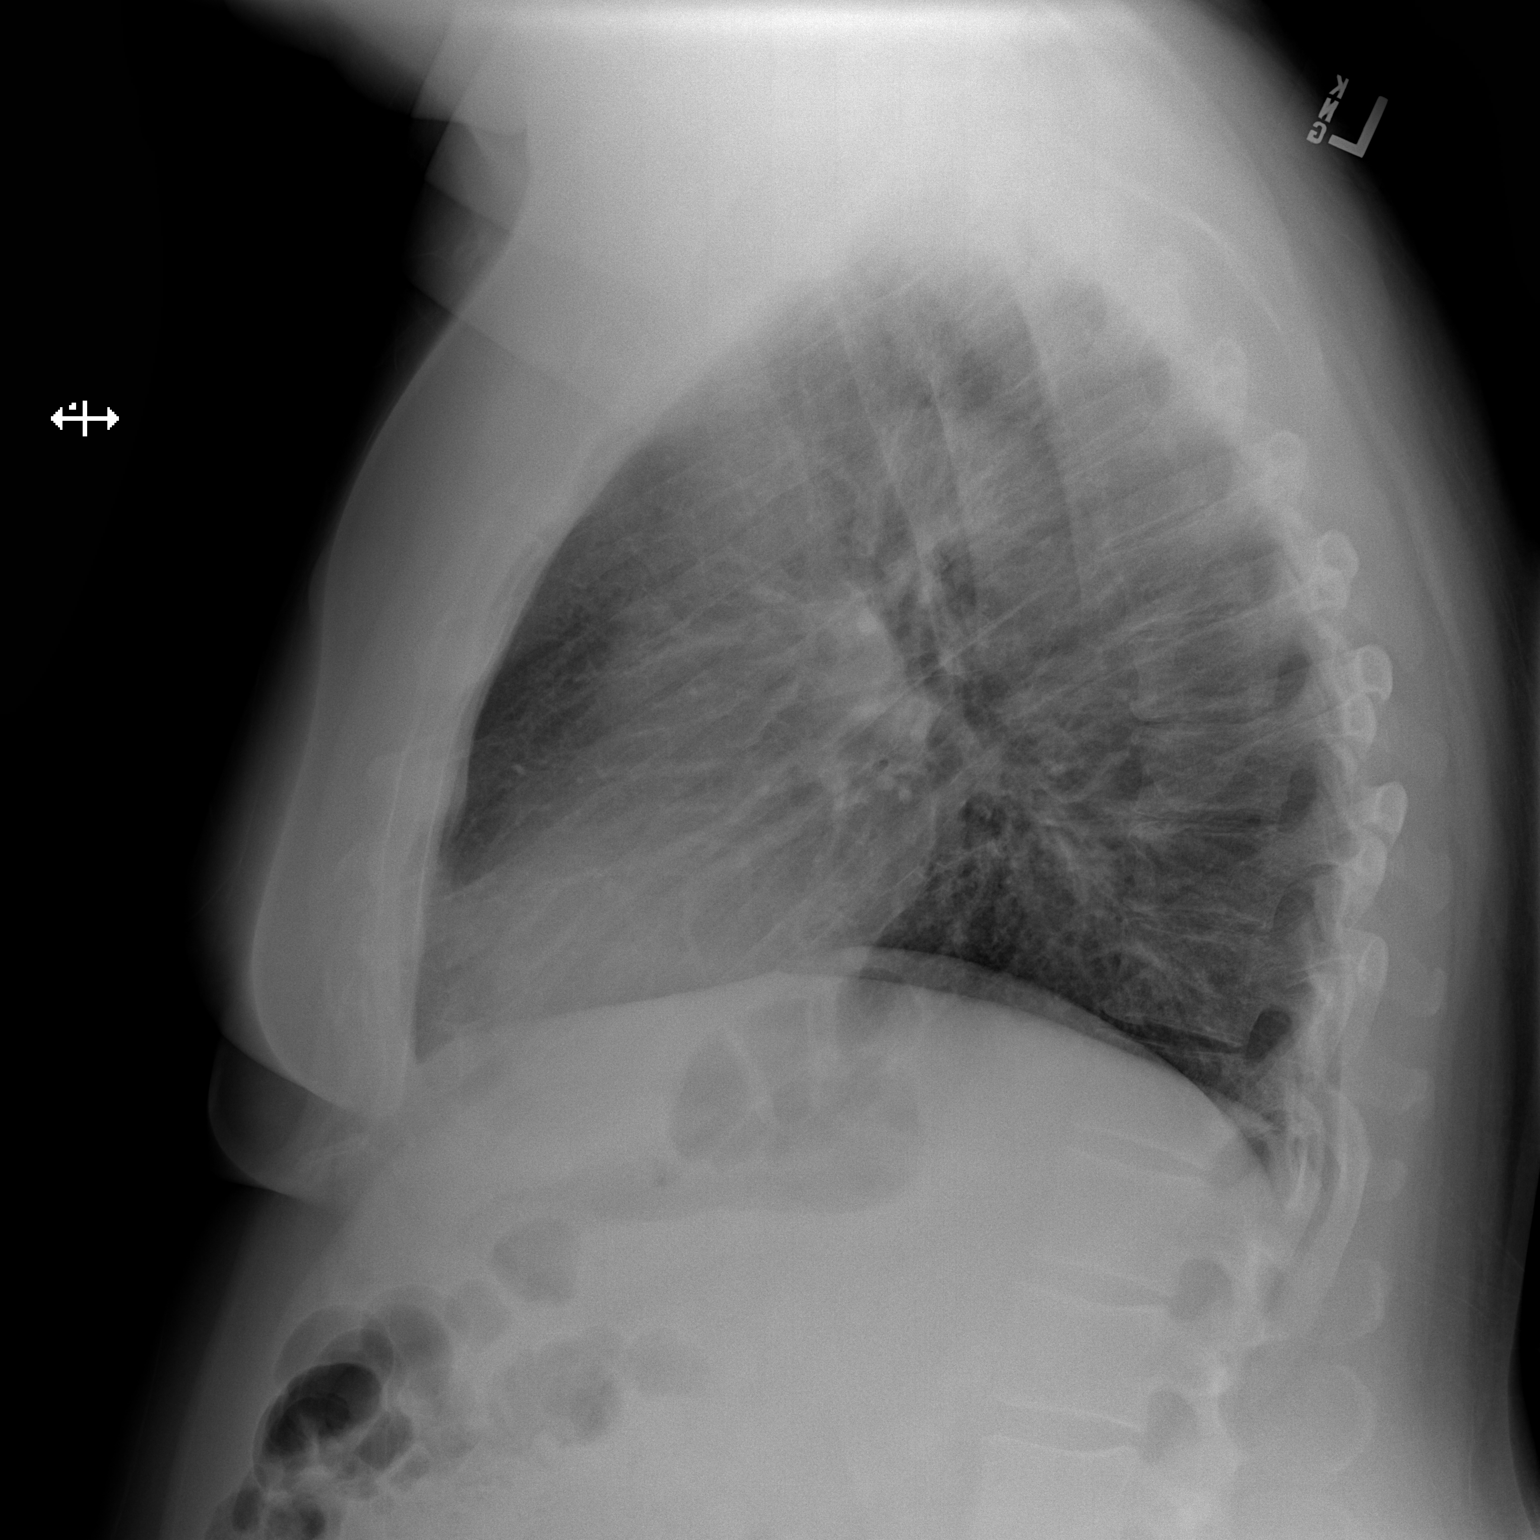

[2 of 2 positions shown; findings below may reference images not displayed]

FINDINGS: The heart size and mediastinal contours are within normal limits.
Both lungs are clear. The visualized skeletal structures are
unremarkable.
IMPRESSION: No active cardiopulmonary disease.
